# Patient Record
Sex: Male | Born: 1990 | Race: Black or African American | Hispanic: No | Marital: Single | Smoking: Never smoker
Health system: Southern US, Community
[De-identification: ages and names within clinical notes are randomized; demographics above are authoritative.]

---

## 2010-06-17 ENCOUNTER — Emergency Department (HOSPITAL_COMMUNITY): Admission: EM | Admit: 2010-06-17 | Discharge: 2010-06-17 | Payer: Self-pay | Admitting: Emergency Medicine
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
## 2015-06-02 ENCOUNTER — Emergency Department (HOSPITAL_COMMUNITY)
Admission: EM | Admit: 2015-06-02 | Discharge: 2015-06-02 | Disposition: A | Discharge status: Home / Self Care | Payer: No Typology Code available for payment source | Attending: Emergency Medicine | Admitting: Emergency Medicine

## 2015-06-02 ENCOUNTER — Encounter (HOSPITAL_COMMUNITY): Payer: Self-pay | Admitting: Emergency Medicine

## 2015-06-02 DIAGNOSIS — Y9389 Activity, other specified: Secondary | ICD-10-CM | POA: Insufficient documentation

## 2015-06-02 DIAGNOSIS — S39012A Strain of muscle, fascia and tendon of lower back, initial encounter: Secondary | ICD-10-CM | POA: Diagnosis not present

## 2015-06-02 DIAGNOSIS — Y998 Other external cause status: Secondary | ICD-10-CM | POA: Diagnosis not present

## 2015-06-02 DIAGNOSIS — S3992XA Unspecified injury of lower back, initial encounter: Secondary | ICD-10-CM | POA: Diagnosis present

## 2015-06-02 DIAGNOSIS — M62830 Muscle spasm of back: Secondary | ICD-10-CM

## 2015-06-02 DIAGNOSIS — Y92410 Unspecified street and highway as the place of occurrence of the external cause: Secondary | ICD-10-CM | POA: Insufficient documentation

## 2015-06-02 MED ORDER — NAPROXEN 500 MG PO TABS: 500.0000 mg | ORAL_TABLET | Freq: Two times a day (BID) | ORAL | Status: AC

## 2015-06-02 MED ORDER — CYCLOBENZAPRINE HCL 5 MG PO TABS
5.0000 mg | ORAL_TABLET | Freq: Three times a day (TID) | ORAL | Status: AC | PRN
Start: 2015-06-02 — End: ?
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
## 2015-06-02 NOTE — Discharge Instructions (Signed)
Do not take the muscle relaxant if driving as it will make you sleepy.  °

## 2015-06-03 ENCOUNTER — Encounter (HOSPITAL_BASED_OUTPATIENT_CLINIC_OR_DEPARTMENT_OTHER): Payer: Self-pay | Admitting: Emergency Medicine
